# Patient Record
Sex: Female | Born: 2000 | Race: White | Hispanic: No | Marital: Single | Smoking: Never smoker
Health system: Southern US, Community
[De-identification: ages and names within clinical notes are randomized; demographics above are authoritative.]

---

## 2021-01-09 ENCOUNTER — Other Ambulatory Visit: Payer: Self-pay

## 2021-01-09 ENCOUNTER — Emergency Department (HOSPITAL_COMMUNITY)
Admission: EM | Admit: 2021-01-09 | Discharge: 2021-01-09 | Disposition: A | Discharge status: Home / Self Care | Payer: No Typology Code available for payment source | Attending: Emergency Medicine | Admitting: Emergency Medicine

## 2021-01-09 DIAGNOSIS — Z91010 Allergy to peanuts: Secondary | ICD-10-CM | POA: Diagnosis not present

## 2021-01-09 DIAGNOSIS — T782XXA Anaphylactic shock, unspecified, initial encounter: Secondary | ICD-10-CM | POA: Diagnosis present

## 2021-01-09 MED ORDER — FAMOTIDINE 40 MG PO TABS: 40.0000 mg | ORAL_TABLET | Freq: Every day | ORAL | 0 refills | Status: AC

## 2021-01-09 MED ORDER — FAMOTIDINE IN NACL 20-0.9 MG/50ML-% IV SOLN
20.0000 mg | Freq: Once | INTRAVENOUS | Status: AC
  Administered 2021-01-09: 20 mg via INTRAVENOUS
  Filled 2021-01-09: qty 50

## 2021-01-09 MED ORDER — METHYLPREDNISOLONE SODIUM SUCC 125 MG IJ SOLR
125.0000 mg | Freq: Once | INTRAMUSCULAR | Status: AC
  Administered 2021-01-09: 125 mg via INTRAVENOUS
  Filled 2021-01-09: qty 2

## 2021-01-09 MED ORDER — PREDNISONE 20 MG PO TABS: 40.0000 mg | ORAL_TABLET | Freq: Every day | ORAL | 0 refills | Status: DC

## 2021-01-09 MED ORDER — EPINEPHRINE 0.3 MG/0.3ML IJ SOAJ: 0.3000 mg | INTRAMUSCULAR | 1 refills | Status: AC | PRN

## 2021-01-09 NOTE — Discharge Instructions (Signed)
You were seen today for anaphylaxis.  Continue prednisone for the next 5 days.  You may take Pepcid and use Benadryl as needed.  If you have any recurrence of symptoms, you should be reevaluated immediately.  Use your EpiPen for any signs or symptoms of anaphylaxis.

## 2021-01-09 NOTE — ED Provider Notes (Signed)
[facility] DEPT Provider Note   CSN: [id number] Arrival date & time: 01/09/21  1949     History No chief complaint on file.   Amy Mccormick is a 20 y.o. female.  HPI     This is a 20 year old female with a history of anaphylaxis to peanuts who presents with an allergic reaction.  Patient reports that she unknowingly ate a granola bar with peanuts in it.  She began to have tingling of the lips and throat swelling.  She used her EpiPen.  She states she has a history of going into anaphylaxis so she actually used 2 EpiPen's around 7:20 PM.  She subsequently was given 50 mg of Benadryl by EMS.  On my evaluation, she is awake, alert.  She denies any ongoing symptoms.  She does states she feels a little jittery.  No chest pain or shortness of breath.  No ongoing throat swelling.  No rash.  No past medical history on file.  There are no problems to display for this patient.  No reported PMH  OB History   No obstetric history on file.     No family history on file.     Home Medications Prior to Admission medications   Medication Sig Start Date End Date Taking? Authorizing Provider  albuterol (VENTOLIN HFA) 108 (90 Base) MCG/ACT inhaler Inhale 1-2 puffs into the lungs as needed for wheezing, shortness of breath or wheezing. 10/27/17  Yes [provider]  EPINEPHrine 0.3 mg/0.3 mL IJ SOAJ injection Inject 0.3 mLs into the muscle once as needed for anaphylaxis. 10/22/10  Yes [provider]  EPINEPHrine 0.3 mg/0.3 mL IJ SOAJ injection Inject 0.3 mg into the muscle as needed for anaphylaxis. 01/09/21  Yes Kassandra Meriweather, Barbette Hair, MD  famotidine (PEPCID) 40 MG tablet Take 1 tablet (40 mg total) by mouth daily. 01/09/21  Yes Christiaan Strebeck, Barbette Hair, MD  levonorgestrel (MIRENA) 20 MCG/24HR IUD 1 each by Intrauterine route once. 2021 inserted   Yes [provider]  predniSONE (DELTASONE) 20 MG tablet Take 2 tablets (40 mg total) by mouth  daily. 01/09/21  Yes Nicolo Tomko, Barbette Hair, MD    Allergies    Peanut-containing drug products  Review of Systems   Review of Systems  Constitutional: Negative for fever.  HENT: Positive for trouble swallowing.   Respiratory: Negative for shortness of breath.   Gastrointestinal: Positive for nausea. Negative for abdominal pain and vomiting.  Skin: Negative for rash.  All other systems reviewed and are negative.   Physical Exam Updated Vital Signs BP (!) 115/59   Pulse (!) 57   Temp 98.5 F (36.9 C) (Oral)   Resp 20   Ht 1.651 m (5\' 5" )   Wt 59.9 kg   SpO2 99%   BMI 21.97 kg/m   Physical Exam Vitals and nursing note reviewed.  Constitutional:      Appearance: She is well-developed. She is not ill-appearing.     Comments: ABCs intact  HENT:     Head: Normocephalic and atraumatic.     Nose: Nose normal.     Mouth/Throat:     Mouth: Mucous membranes are moist.     Comments: No posterior oropharyngeal erythema Eyes:     Pupils: Pupils are equal, round, and reactive to light.  Cardiovascular:     Rate and Rhythm: Normal rate and regular rhythm.     Heart sounds: Normal heart sounds.  Pulmonary:     Effort: Pulmonary effort is normal. No respiratory  distress.     Breath sounds: No wheezing.  Abdominal:     General: Bowel sounds are normal.     Palpations: Abdomen is soft.     Tenderness: There is no abdominal tenderness.     Hernia: No hernia is present.  Musculoskeletal:     Cervical back: Neck supple.     Right lower leg: No edema.     Left lower leg: No edema.  Skin:    General: Skin is warm and dry.  Neurological:     Mental Status: She is alert and oriented to person, place, and time.  Psychiatric:        Mood and Affect: Mood normal.     ED Results / Procedures / Treatments   Labs (all labs ordered are listed, but only abnormal results are displayed) Labs Reviewed - No data to display  EKG None  Radiology No results  found.  Procedures Procedures   Medications Ordered in ED Medications  famotidine (PEPCID) IVPB 20 mg premix (0 mg Intravenous Stopped 01/09/21 2156)  methylPREDNISolone sodium succinate (SOLU-MEDROL) 125 mg/2 mL injection 125 mg (125 mg Intravenous Given 01/09/21 2027)    ED Course  I have reviewed the triage vital signs and the nursing notes.  Pertinent labs & imaging results that were available during my care of the patient were reviewed by me and considered in my medical decision making (see chart for details).    MDM Rules/Calculators/A&P                          Patient presents after an episode of anaphylaxis.  She administered 2 epinephrine pens within 5 minutes of each other.  Not clear that she was worsening but states she was anxious because she has had anaphylaxis in the past.  Currently her symptoms are much improved.  No signs or symptoms on exam.  Her O2 sats are 100% and her vital signs are reassuring.  She was given Benadryl in route.  She was subsequently given Pepcid and Solu-Medrol.  She was observed in the emergency department for approximately 3-1/2 hours.  She was observed for 4 hours post EpiPen administration without any recurrence of symptoms.  Will discharge with prednisone, Pepcid, Benadryl as needed.  EpiPen was refilled.  After history, exam, and medical workup I feel the patient has been appropriately medically screened and is safe for discharge home. Pertinent diagnoses were discussed with the patient. Patient was given return precautions.  Final Clinical Impression(s) / ED Diagnoses Final diagnoses:  Anaphylaxis, initial encounter    Rx / DC Orders ED Discharge Orders         Ordered    predniSONE (DELTASONE) 20 MG tablet  Daily        01/09/21 2303    EPINEPHrine 0.3 mg/0.3 mL IJ SOAJ injection  As needed        01/09/21 2303    famotidine (PEPCID) 40 MG tablet  Daily        01/09/21 2303           Merryl Hacker, MD 01/09/21 [phone]

## 2021-01-09 NOTE — ED Triage Notes (Signed)
Patient brought in by [facility] Patient allergic reaction to peanuts in a granola bar. Patient gave herself two epi pens. Patient had 50 mg of benadryl IV. Patient stills feels her throat is tight. Patient has a hx of peanut allergy.

## 2021-02-02 ENCOUNTER — Other Ambulatory Visit: Payer: Self-pay | Admitting: Adult Health

## 2021-02-02 ENCOUNTER — Other Ambulatory Visit (HOSPITAL_COMMUNITY): Payer: Self-pay | Admitting: Adult Health

## 2021-02-02 DIAGNOSIS — N110 Nonobstructive reflux-associated chronic pyelonephritis: Secondary | ICD-10-CM

## 2021-02-02 DIAGNOSIS — N3001 Acute cystitis with hematuria: Secondary | ICD-10-CM

## 2021-02-09 ENCOUNTER — Ambulatory Visit (HOSPITAL_COMMUNITY): Payer: No Typology Code available for payment source

## 2021-02-13 ENCOUNTER — Ambulatory Visit (HOSPITAL_COMMUNITY): Admission: RE | Admit: 2021-02-13 | Payer: No Typology Code available for payment source | Source: Ambulatory Visit

## 2021-02-13 ENCOUNTER — Encounter (HOSPITAL_COMMUNITY): Payer: Self-pay

## 2021-03-06 ENCOUNTER — Ambulatory Visit (HOSPITAL_COMMUNITY)
Admission: RE | Admit: 2021-03-06 | Discharge: 2021-03-06 | Disposition: A | Discharge status: Home / Self Care | Payer: No Typology Code available for payment source | Source: Ambulatory Visit | Attending: Adult Health | Admitting: Adult Health

## 2021-03-06 ENCOUNTER — Other Ambulatory Visit: Payer: Self-pay

## 2021-03-06 DIAGNOSIS — N3000 Acute cystitis without hematuria: Secondary | ICD-10-CM | POA: Insufficient documentation

## 2021-03-06 DIAGNOSIS — N3001 Acute cystitis with hematuria: Secondary | ICD-10-CM

## 2021-03-06 DIAGNOSIS — C641 Malignant neoplasm of right kidney, except renal pelvis: Secondary | ICD-10-CM | POA: Diagnosis not present

## 2021-03-06 DIAGNOSIS — N139 Obstructive and reflux uropathy, unspecified: Secondary | ICD-10-CM | POA: Insufficient documentation

## 2021-03-06 DIAGNOSIS — N110 Nonobstructive reflux-associated chronic pyelonephritis: Secondary | ICD-10-CM

## 2021-03-06 MED ORDER — IOTHALAMATE MEGLUMINE 17.2 % UR SOLN
250.0000 mL | Freq: Once | URETHRAL | Status: AC | PRN
  Administered 2021-03-06: 125 mL via INTRAVESICAL

## 2021-03-06 MED ORDER — DIATRIZOATE MEGLUMINE 30 % UR SOLN
URETHRAL | Status: DC | PRN
  Administered 2021-03-06: 125 mL

## 2021-06-24 ENCOUNTER — Ambulatory Visit (INDEPENDENT_AMBULATORY_CARE_PROVIDER_SITE_OTHER): Payer: No Typology Code available for payment source | Admitting: Orthopaedic Surgery

## 2021-06-24 ENCOUNTER — Encounter: Payer: Self-pay | Admitting: Orthopaedic Surgery

## 2021-06-24 DIAGNOSIS — S92352A Displaced fracture of fifth metatarsal bone, left foot, initial encounter for closed fracture: Secondary | ICD-10-CM

## 2021-06-24 NOTE — Progress Notes (Signed)
   Office Visit Note   Patient: Amy Mccormick           Date of Birth: 2001-09-05           MRN: [id number] Visit Date: 06/24/2021              Requested by: [place] [id number] [place],  [place] [address] PCP: Placido Sou, MD   Assessment & Plan: Visit Diagnoses:  1. Closed displaced fracture of fifth metatarsal bone of left foot, initial encounter     Plan: I spoke with her in length in detail about this fracture.  This is definitely nonoperative.  She can try to put some weight on her foot in a postop shoe but let pain be her guide.  I will give her a note keeping her out of dance and out of work until further notice.  All questions and concerns were answered and addressed.  She can use an Ace wrap for support and comfort as well.  I showed them how to wrap her foot.  I would like to see her back in 2 weeks with a repeat 3 views the left foot.  Follow-Up Instructions: Return in about 2 weeks (around 07/08/2021).   Orders:  No orders of the defined types were placed in this encounter.  No orders of the defined types were placed in this encounter.     Procedures: No procedures performed   Clinical Data: No additional findings.   Subjective: No chief complaint on file. Amy Mccormick comes in today for evaluation treatment of an acute left foot fracture.  She was seen at an outlying clinic and x-rays showed an oblique fracture that was minimally displaced of the left foot fifth metatarsal shaft.  Her mother actually has the x-rays on her phone and I was able to review these.  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  She is in a postop shoe with crutches as well.  She has been nonweightbearing.  She had an accident falling down some stairs on Monday injuring this foot.  She is not a smoker not a diabetic.  She has no significant active medical issues other than asthma.  HPI  Review of Systems There is no listed acute  medical problems such as chest pain, headache, fever, chills, nausea, vomiting  Objective: Vital Signs: There were no vitals taken for this visit.  Physical Exam She is alert and orient x3 and in no acute distress Ortho Exam Her left foot does have appropriate swelling and bruising around the midfoot and dorsally and lateral.  Her foot is well-perfused and her ankle is well located and her foot is aligned appropriately. Specialty Comments:  No specialty comments available.  Imaging: No results found. X-rays on her mother's phone are reviewed and it does show metatarsal shaft fracture of the fifth metatarsal with minimal displacement.  PMFS History: There are no problems to display for this patient.  History reviewed. No pertinent past medical history.  History reviewed. No pertinent family history.  History reviewed. No pertinent surgical history. Social History   Occupational History   Not on file  Tobacco Use   Smoking status: Not on file   Smokeless tobacco: Not on file  Substance and Sexual Activity   Alcohol use: Not on file   Drug use: Not on file   Sexual activity: Not on file

## 2021-07-02 ENCOUNTER — Telehealth: Payer: Self-pay | Admitting: Physician Assistant

## 2021-07-02 NOTE — Telephone Encounter (Signed)
Pt called for refill on hydrocodone.

## 2021-07-03 ENCOUNTER — Other Ambulatory Visit: Payer: Self-pay | Admitting: Orthopaedic Surgery

## 2021-07-03 MED ORDER — HYDROCODONE-ACETAMINOPHEN 5-325 MG PO TABS: 1.0000 | ORAL_TABLET | Freq: Four times a day (QID) | ORAL | 0 refills | Status: DC | PRN

## 2021-07-03 NOTE — Telephone Encounter (Signed)
Please advise 

## 2021-07-03 NOTE — Telephone Encounter (Signed)
Lvm informing pt.

## 2021-07-09 ENCOUNTER — Ambulatory Visit (INDEPENDENT_AMBULATORY_CARE_PROVIDER_SITE_OTHER): Payer: No Typology Code available for payment source | Admitting: Physician Assistant

## 2021-07-09 ENCOUNTER — Ambulatory Visit: Payer: Self-pay

## 2021-07-09 DIAGNOSIS — S92352D Displaced fracture of fifth metatarsal bone, left foot, subsequent encounter for fracture with routine healing: Secondary | ICD-10-CM

## 2021-07-09 DIAGNOSIS — S92352A Displaced fracture of fifth metatarsal bone, left foot, initial encounter for closed fracture: Secondary | ICD-10-CM

## 2021-07-09 NOTE — Progress Notes (Signed)
   Office Visit Note   Patient: Amy Mccormick           Date of Birth: [birth year]           MRN: [id number] Visit Date: 07/09/2021              Requested by: [place] [id number] [place],  [place] [address] PCP: Placido Sou, MD   Assessment & Plan: Visit Diagnoses:  1. Closed displaced fracture of fifth metatarsal bone of left foot, initial encounter   2. Closed displaced fracture of fifth metatarsal bone of left foot with routine healing, subsequent encounter     Plan: Place her in a cam walker boot she can begin 50% weightbearing.  Then at approximately 6 weeks post injury have her go to full weightbearing in the cam walker boot.  Elevation wiggling toes encouraged.  Questions encouraged and answered at length  Follow-Up Instructions: Return in about 4 weeks (around 08/06/2021).   Orders:  Orders Placed This Encounter  Procedures   XR Foot Complete Left   No orders of the defined types were placed in this encounter.     Procedures: No procedures performed   Clinical Data: No additional findings.   Subjective: Chief Complaint  Patient presents with   Left Foot - Follow-up    HPI Gizell returns today approximately 2 and half weeks status post left foot fifth metatarsal shaft fracture.  She ambulates in the postop shoe and crutches.  Notes that the swelling and bruising have been diminished.  She is basically been nonweightbearing she does not feel that she gives her much support.  She has had no shortness of breath no calf pain. Review of Systems See HPI.  Objective: Vital Signs: There were no vitals taken for this visit.  Physical Exam General well-developed well-nourished female no acute distress mood affect appropriate Ortho Exam Left foot tenderness over the fifth metatarsal shaft.  No significant ecchymosis bruising.  There is no rashes skin lesions ulcerations or impending ulcers involving the left foot.  Dorsal pedal pulses  2+.  Left calf supple nontender.  Dorsiflexion plantarflexion left ankle intact.  EHL/ FHL intact left foot. Specialty Comments:  No specialty comments available.  Imaging: Radiographs 3 views left foot: Oblique fracture of the fifth metatarsal shaft remains unchanged in overall position alignment.  There is no appreciable callus formation.  No other fractures identified.   PMFS History: There are no problems to display for this patient.  No past medical history on file.  No family history on file.  No past surgical history on file. Social History   Occupational History   Not on file  Tobacco Use   Smoking status: Not on file   Smokeless tobacco: Not on file  Substance and Sexual Activity   Alcohol use: Not on file   Drug use: Not on file   Sexual activity: Not on file

## 2021-08-06 ENCOUNTER — Ambulatory Visit: Payer: Self-pay

## 2021-08-06 ENCOUNTER — Ambulatory Visit: Payer: No Typology Code available for payment source | Admitting: Physician Assistant

## 2021-08-06 DIAGNOSIS — S92352A Displaced fracture of fifth metatarsal bone, left foot, initial encounter for closed fracture: Secondary | ICD-10-CM | POA: Diagnosis not present

## 2021-08-06 NOTE — Progress Notes (Signed)
HPI: Amy Mccormick returns today for follow-up of her left foot fifth metatarsal shaft fracture.  Again she injured the foot on 06/22/2021.  She comes in today worried.  Regular shoes.  She has been wearing these for the last 2 weeks.  She was to remain in a cam walker boot.  States that it made her legs uneven.  She does note that the foot is overall less tender.  She states she can do stairs now.  She has not tried dancing again as of yet.  She does have a dance class in college.  Stating nothing for pain at this point.  She is trying ice heat and rest to control the discomfort/pain in the foot.  Physical exam: General well-developed well-nourished female no acute distress.  Ambulates without any assistive device.  Left foot she has some mild tenderness with manipulation of the fifth metatarsal shaft region.  There is some palpable callus.  No skin breakdown.  Foot is neurovascular intact.  Radiographs: Left foot 3 views shows the fifth metatarsal shaft fracture to be unchanged in overall position alignment.  There is some early callus formation.  Fracture site still evident both.  Impression: Left foot fifth metatarsal shaft fracture  Plan: At this point time I do like for her to go back in the cam walker boot weightbearing as tolerated.  She needs to be in the boot whenever she is up ambulating.  She is to refrain from any dance or sports activities.  See her back in 1 month obtain 3 views of the left foot at that time.  Questions encouraged and answered at length

## 2021-09-03 ENCOUNTER — Other Ambulatory Visit: Payer: Self-pay

## 2021-09-03 ENCOUNTER — Ambulatory Visit (INDEPENDENT_AMBULATORY_CARE_PROVIDER_SITE_OTHER): Payer: No Typology Code available for payment source

## 2021-09-03 ENCOUNTER — Ambulatory Visit (INDEPENDENT_AMBULATORY_CARE_PROVIDER_SITE_OTHER): Payer: No Typology Code available for payment source | Admitting: Physician Assistant

## 2021-09-03 ENCOUNTER — Encounter: Payer: Self-pay | Admitting: Physician Assistant

## 2021-09-03 DIAGNOSIS — S92352D Displaced fracture of fifth metatarsal bone, left foot, subsequent encounter for fracture with routine healing: Secondary | ICD-10-CM | POA: Diagnosis not present

## 2021-09-03 NOTE — Progress Notes (Signed)
HPI: Amy Mccormick returns today now 10 weeks status post left foot fifth metatarsal shaft fracture.  States she is having no real pain in the foot at this point in time.  She just has an achiness whenever her foot gets cold.  She is in the cam walker boot.  Physical exam: General well-developed well-nourished female who walks without any assistive device.  Left foot: She has no tenderness with palpation over the fifth metatarsal shaft region.  There is palpable callus.  No skin breakdown.  Radiographs: 3 views left foot show the fracture site with some interval consolidation at the fracture site still quite evident.  No change in overall position alignment.  No other acute fractures identified.   Impression: Left foot fifth metatarsal fracture  Plan: At this point time she will remain in a cam walker boot.  We will see her back around November 30 to repeat radiographs of the foot if the fracture is still evident recommend bone stimulator.  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  She would like to avoid surgery if possible.  Questions were encouraged and answered.

## 2021-09-16 ENCOUNTER — Encounter: Payer: Self-pay | Admitting: Physician Assistant

## 2021-09-16 ENCOUNTER — Ambulatory Visit (INDEPENDENT_AMBULATORY_CARE_PROVIDER_SITE_OTHER): Payer: No Typology Code available for payment source | Admitting: Physician Assistant

## 2021-09-16 ENCOUNTER — Ambulatory Visit (INDEPENDENT_AMBULATORY_CARE_PROVIDER_SITE_OTHER): Payer: No Typology Code available for payment source

## 2021-09-16 ENCOUNTER — Other Ambulatory Visit: Payer: Self-pay

## 2021-09-16 DIAGNOSIS — S92352D Displaced fracture of fifth metatarsal bone, left foot, subsequent encounter for fracture with routine healing: Secondary | ICD-10-CM

## 2021-09-16 DIAGNOSIS — S92352K Displaced fracture of fifth metatarsal bone, left foot, subsequent encounter for fracture with nonunion: Secondary | ICD-10-CM

## 2021-09-16 NOTE — Progress Notes (Signed)
HPI: Amy Mccormick returns today follow-up of her left fifth metatarsal shaft fracture.  Again she injured her foot on 06/22/2021.  She states she is having no significant pain in the left foot.  However she is trying to get back to dance at a high level of activity.  She continues to wear the cam walker boot.  Physical exam: General well-developed well-nourished female no acute distress. Left foot no tenderness with manipulation palpation of the fifth metatarsal shaft region.  There is palpable callus pain.  No rashes skin lesions ulcerations or impending ulcers.  Left foot is neurovascular intact.   Radiographs: 3 views left foot shows the fifth metatarsal shaft fracture and change in overall position alignment.  Minimal callus formation seen at the fracture site consistent with a nonunion.  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  She will continue cam walker boot.  We will see her back in 1 month obtain 3 views of her left foot at that time.  Questions encouraged and answered at length.

## 2021-09-17 ENCOUNTER — Other Ambulatory Visit: Payer: Self-pay

## 2021-09-25 ENCOUNTER — Telehealth: Payer: Self-pay | Admitting: Orthopaedic Surgery

## 2021-09-25 NOTE — Telephone Encounter (Signed)
Pt states she need a call back. She need a much sooner appt. Explained to pt one opening until 1/4 past her 12/28 appt. She states she need xrays and and  Dr. Ninfa [place] told her she could have sooner appt. Please call pt at [phone]

## 2021-09-25 NOTE — Telephone Encounter (Signed)
Called and scheduled

## 2021-09-28 ENCOUNTER — Encounter: Payer: Self-pay | Admitting: Physician Assistant

## 2021-09-28 ENCOUNTER — Ambulatory Visit: Payer: Self-pay

## 2021-09-28 ENCOUNTER — Ambulatory Visit (INDEPENDENT_AMBULATORY_CARE_PROVIDER_SITE_OTHER): Payer: No Typology Code available for payment source | Admitting: Physician Assistant

## 2021-09-28 DIAGNOSIS — S92352D Displaced fracture of fifth metatarsal bone, left foot, subsequent encounter for fracture with routine healing: Secondary | ICD-10-CM

## 2021-09-28 DIAGNOSIS — S92302K Fracture of unspecified metatarsal bone(s), left foot, subsequent encounter for fracture with nonunion: Secondary | ICD-10-CM

## 2021-09-28 NOTE — Progress Notes (Signed)
HPI: Amy Mccormick returns today for follow-up of her left fifth metatarsal shaft fracture.  Again she has developed a nonunion.  Fracture injury occurred on 06/22/2021.  She is here for radiographs of the left foot.  Radiographs: 3 views left foot show the fifth metatarsal shaft fracture to still be evident.  This is consistent with nonunion.  Again there is minimal callus that remains unchanged from radiographs on 09/16/2021.  Impression: Left foot fifth metatarsal fracture nonunion  Plan: Recommend bone stimulator.  Would like to see her back in 1 month after starting bone stimulator to the fifth metatarsal fracture.  Questions were encouraged and answered.

## 2021-10-14 ENCOUNTER — Encounter: Payer: Self-pay | Admitting: Orthopaedic Surgery

## 2021-10-14 ENCOUNTER — Other Ambulatory Visit: Payer: Self-pay

## 2021-10-14 ENCOUNTER — Ambulatory Visit (INDEPENDENT_AMBULATORY_CARE_PROVIDER_SITE_OTHER): Payer: No Typology Code available for payment source | Admitting: Orthopaedic Surgery

## 2021-10-14 DIAGNOSIS — S92352D Displaced fracture of fifth metatarsal bone, left foot, subsequent encounter for fracture with routine healing: Secondary | ICD-10-CM

## 2021-10-14 NOTE — Progress Notes (Signed)
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  There is been a delayed union of the fracture.  She finally got a bone stimulator about 2 weeks ago.  She is not a smoker and is very healthy and active.  She reports no pain at all now with her fifth metatarsal.  She has been compliant with wearing her cam walking boot.  On exam there is no motion between the distal and proximal metatarsal on the left foot in terms of the fifth metatarsal when I stress it.  There is no pain when I stressed that as well.  I did not x-ray today since she is only had a bone stimulator for 2 weeks.  The last x-rays showed fracture line still visible but abundant callus formation.  At this point I do feel that she is going to go on to heal this fracture.  We will see her in 2 weeks with a repeat 3 views of the left foot.  She can stop her cam walking boot.  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  Hopefully in 2 weeks we will let her know about getting back to dancing as well.  I am encouraged by the fact that she is asymptomatic.

## 2021-10-28 ENCOUNTER — Ambulatory Visit: Payer: No Typology Code available for payment source | Admitting: Orthopaedic Surgery

## 2021-10-28 ENCOUNTER — Encounter: Payer: Self-pay | Admitting: Orthopaedic Surgery

## 2021-10-28 ENCOUNTER — Ambulatory Visit (INDEPENDENT_AMBULATORY_CARE_PROVIDER_SITE_OTHER): Payer: No Typology Code available for payment source

## 2021-10-28 DIAGNOSIS — S92352D Displaced fracture of fifth metatarsal bone, left foot, subsequent encounter for fracture with routine healing: Secondary | ICD-10-CM

## 2021-10-28 NOTE — Progress Notes (Signed)
The patient is now just over 4 months into a left foot fifth metatarsal shaft fracture.  She is only 21 years old.  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  She denies any pain now over her fifth metatarsal.  We have been having her use a bone stimulator as well due to a nonunion/delayed union.  She says she really feels normal at this standpoint.  I did process the fracture site of her left foot and she has no pain at all.  There is no mobility between the fracture fragments at all over her left foot fifth metatarsal.  3 views of the foot are reviewed today x-ray was and compared to previous x-rays.  There is abundant healing and I believe the fracture is healed based on clinical exam and x-ray findings.  This point she will return to dancing as comfort allows.  There is no restrictions.  If she starts developing foot pain she will let us know.  I did have her continue the bone stimulator for another 4 weeks.  Follow-up is as needed otherwise.

## 2021-12-26 IMAGING — RF DG VCUG
9 of 13 series · 13 of 19 positions shown · non-contrast
Comparison: Renal ultrasound of 01/16/2021, without report.

CLINICAL DATA: Recurrent urinary tract infections.

EXAM:
VOIDING CYSTOURETHROGRAM
TECHNIQUE: After catheterization of the urinary bladder following sterile
technique by nursing personnel, the bladder was filled with 125 cc
ml Cysto-hypaque 30% by drip infusion. Serial spot images were
obtained during bladder filling and voiding.
FLUOROSCOPY TIME:  Fluoroscopy Time:  2 minutes and 36 seconds
Radiation Exposure Index (if provided by the fluoroscopic device):
22.7 mGy
Number of Acquired Spot Images: 0

[Series 1: cp_standard · 0.30mm/px · 1 of 1 slices shown (1 of 9)]
[im 1/1]
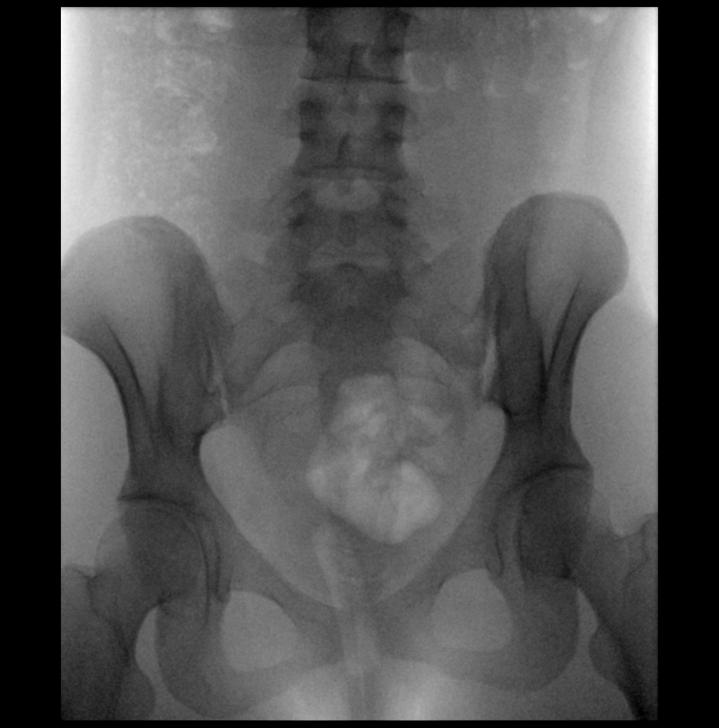

[Series 3: cp_standard · 0.20mm/px · 1 of 1 slices shown (2 of 9)]
[im 1/1]
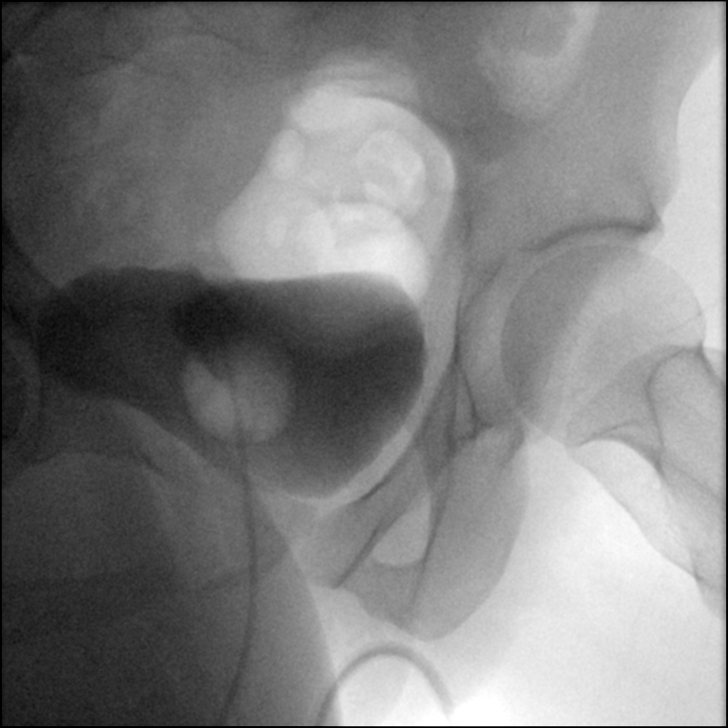

[Series 4: cp_standard · 0.20mm/px · 1 of 1 slices shown (3 of 9)]
[im 1/1]
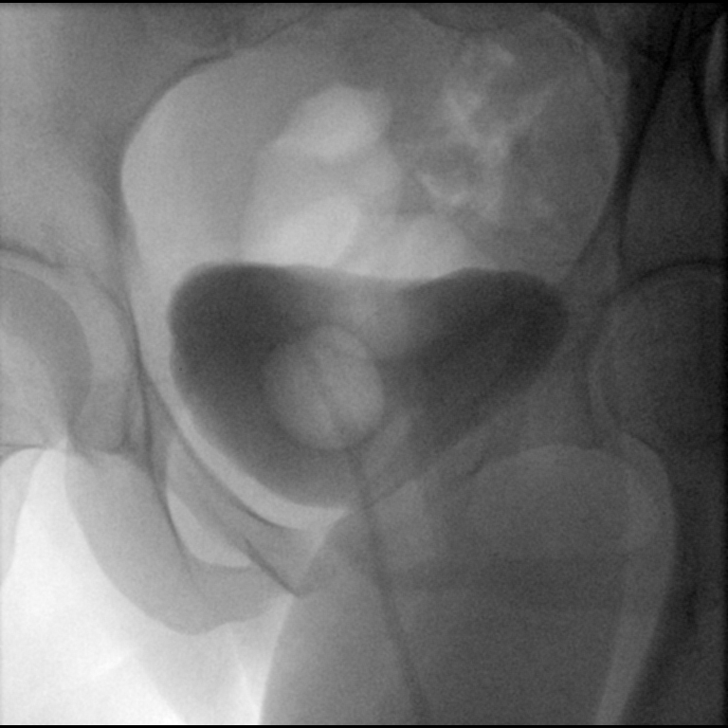

[Series 6: cp_standard · 0.20mm/px · 1 of 1 slices shown (4 of 9)]
[im 1/1]
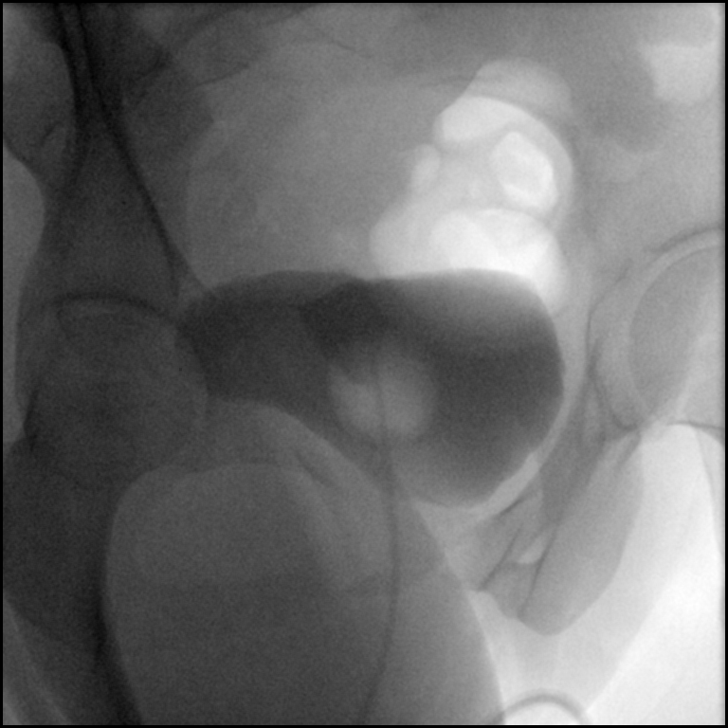

[Series 7: cp_standard · 0.20mm/px · 1 of 1 slices shown (5 of 9)]
[im 1/1]
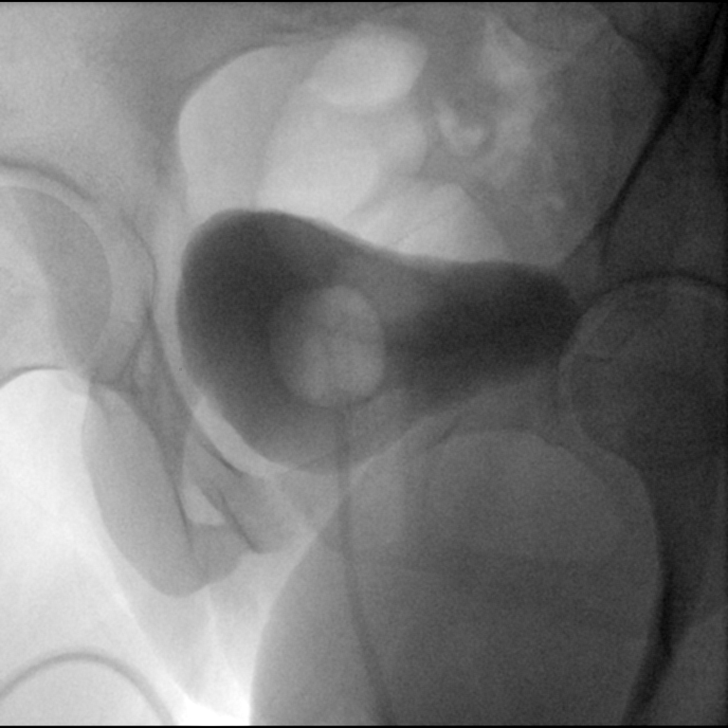

[Series 9: cp_standard · 0.40mm/px · 3 of 368 frames shown (6 of 9)]
[frame 56/368]
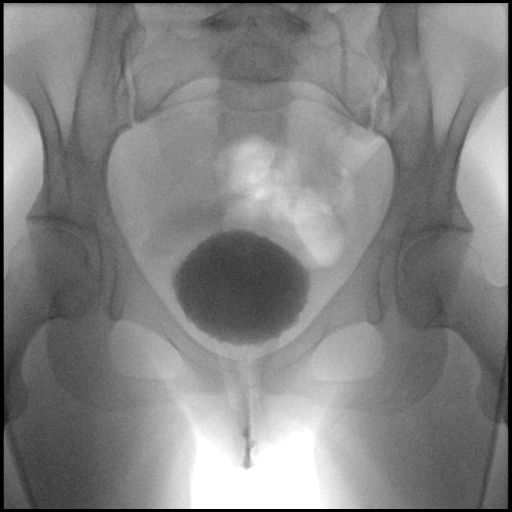
[frame 185/368]
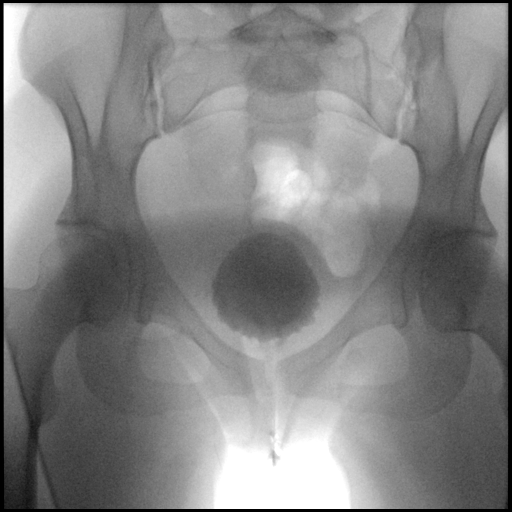
[frame 313/368]
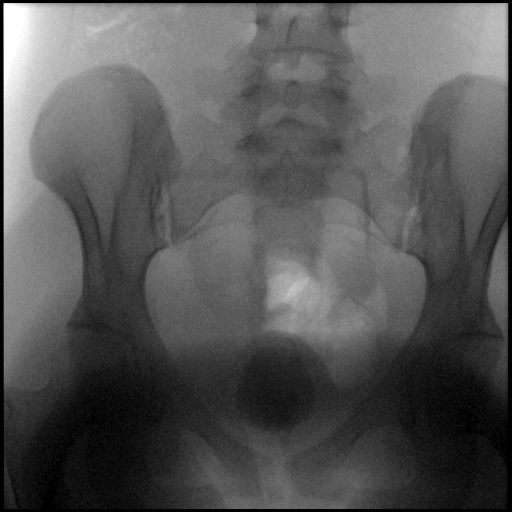

[Series 10: cp_standard · 0.40mm/px · 3 of 107 frames shown (7 of 9)]
[frame 17/107]
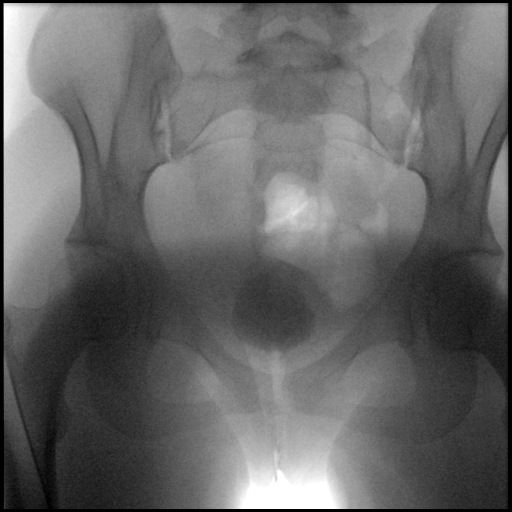
[frame 54/107]
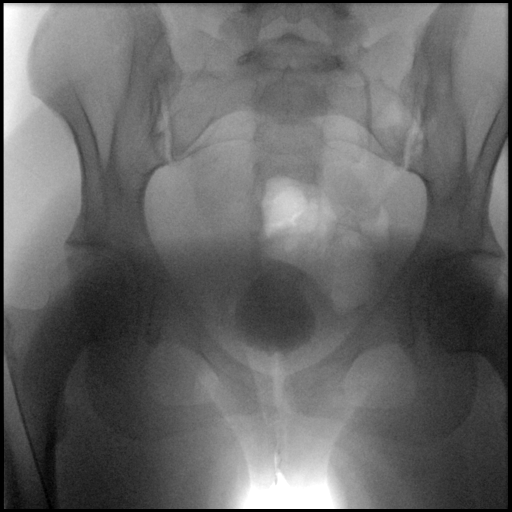
[frame 91/107]
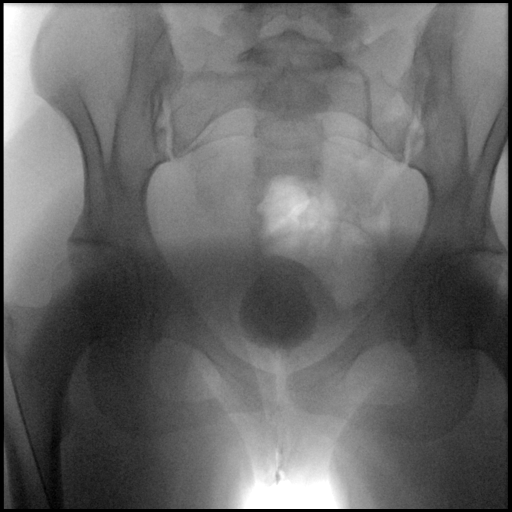

[Series 11: cp_standard · 0.20mm/px · 1 of 1 slices shown (8 of 9)]
[im 1/1]
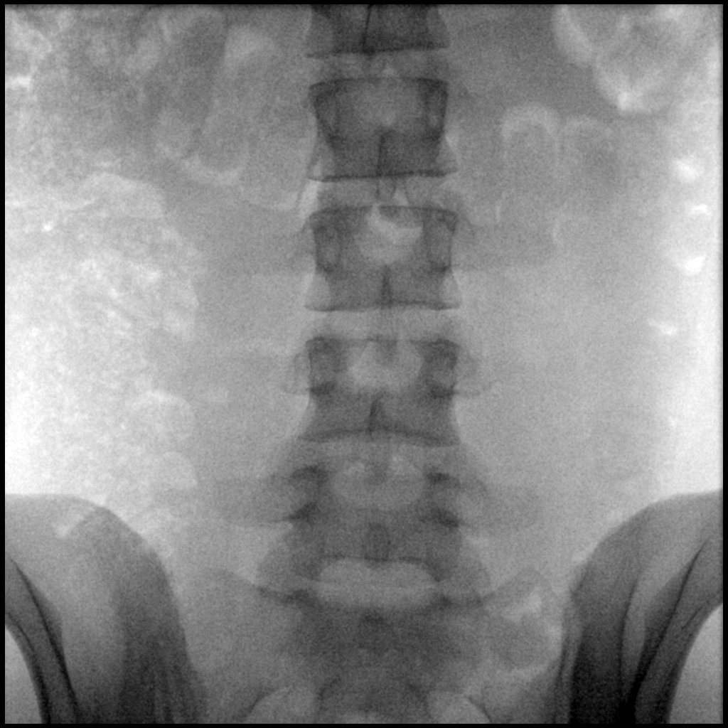

[Series 13: cp_standard · 0.30mm/px · 1 of 1 slices shown (9 of 9)]
[im 1/1]
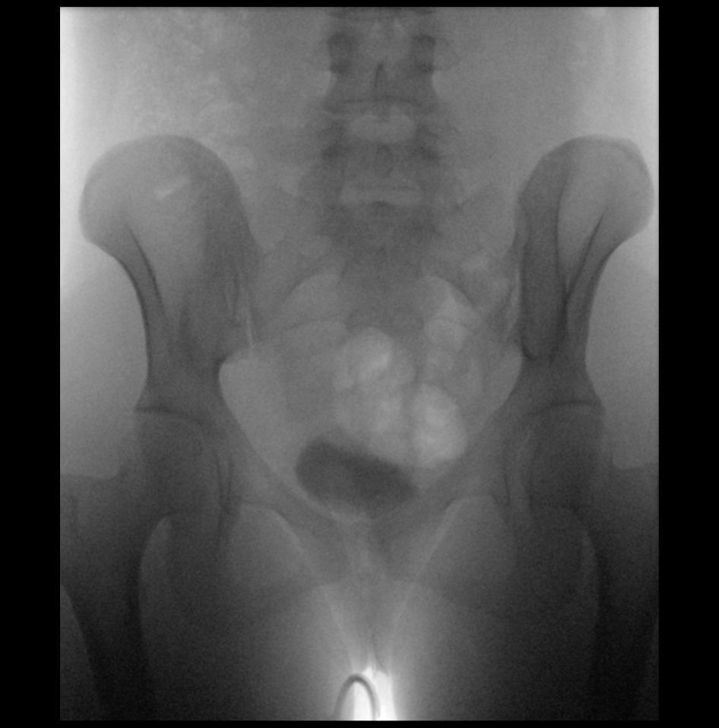

[13 of 19 positions shown; findings below may reference images not displayed]

FINDINGS: Preprocedure scout film demonstrates nonobstructive bowel gas
pattern. A tampon is in place.

Partial and full images of the bladder demonstrate normal bladder
caliber. No vesicoureteral reflux.

With voiding, the urethra is grossly normal. Contrast fills the tub
surrounding the patient. No evidence of vesicoureteral reflux.
Minimal postvoid residual.
IMPRESSION: Normal voiding cystourethrogram.

## 2023-02-01 ENCOUNTER — Emergency Department (HOSPITAL_COMMUNITY): Payer: No Typology Code available for payment source

## 2023-02-01 ENCOUNTER — Encounter (HOSPITAL_COMMUNITY): Payer: Self-pay

## 2023-02-01 ENCOUNTER — Emergency Department (HOSPITAL_COMMUNITY)
Admission: EM | Admit: 2023-02-01 | Discharge: 2023-02-02 | Disposition: A | Discharge status: Home / Self Care | Payer: No Typology Code available for payment source | Attending: Emergency Medicine | Admitting: Emergency Medicine

## 2023-02-01 ENCOUNTER — Other Ambulatory Visit: Payer: Self-pay

## 2023-02-01 DIAGNOSIS — W1789XA Other fall from one level to another, initial encounter: Secondary | ICD-10-CM | POA: Diagnosis not present

## 2023-02-01 DIAGNOSIS — S99922A Unspecified injury of left foot, initial encounter: Secondary | ICD-10-CM | POA: Diagnosis present

## 2023-02-01 DIAGNOSIS — Y9289 Other specified places as the place of occurrence of the external cause: Secondary | ICD-10-CM | POA: Diagnosis not present

## 2023-02-01 DIAGNOSIS — Z91010 Allergy to peanuts: Secondary | ICD-10-CM | POA: Insufficient documentation

## 2023-02-01 NOTE — ED Triage Notes (Signed)
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 Rolled left ankle. Was able to finish performance but pain intensified and began swelling.   Neuro intact.

## 2023-02-02 ENCOUNTER — Telehealth: Payer: Self-pay | Admitting: Orthopaedic Surgery

## 2023-02-02 NOTE — ED Provider Notes (Incomplete)
[place] [facility] AT [facility] Provider Note   CSN: [id number] Arrival date & time: 02/01/23  2226     History  Chief Complaint  Patient presents with  . Foot Injury    Amy Mccormick is a 22 y.o. female with no documented medical history.  Patient presents to ED for evaluation of left foot injury.  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  Patient reports that she landed on the lateral portion of her left foot.  Patient reports history of metatarsal fracture of the left foot in 2022.  Patient states that when she landed on her lateral left foot she had immediate pain.  Patient denies pain in her ankle, falling or hitting her head.  Patient denies medications prior to arrival.   Foot Injury      Home Medications Prior to Admission medications   Medication Sig Start Date End Date Taking? Authorizing Provider  albuterol (VENTOLIN HFA) 108 (90 Base) MCG/ACT inhaler Inhale 1-2 puffs into the lungs as needed for wheezing, shortness of breath or wheezing. 10/27/17   [provider]  EPINEPHrine 0.3 mg/0.3 mL IJ SOAJ injection Inject 0.3 mLs into the muscle once as needed for anaphylaxis. 10/22/10   [provider]  EPINEPHrine 0.3 mg/0.3 mL IJ SOAJ injection Inject 0.3 mg into the muscle as needed for anaphylaxis. 01/09/21   Horton, Mayer Masker, MD  famotidine (PEPCID) 40 MG tablet Take 1 tablet (40 mg total) by mouth daily. 01/09/21   Horton, Mayer Masker, MD  LO LOESTRIN FE 1 MG-10 MCG / 10 MCG tablet Take 1 tablet by mouth daily. 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 EXAM: LEFT FOOT - COMPLETE 3+ VIEW COMPARISON:  Radiographs dated October 28, 2021 FINDINGS: There is no evidence of fracture or dislocation. Chronic healed fracture of the distal fifth metatarsal shaft. There is no evidence of arthropathy or other focal bone abnormality. Soft tissue swelling about the lateral aspect of the foot. IMPRESSION: 1. No acute fracture or dislocation. 2. Chronic healed fracture of the distal fifth metatarsal shaft. Electronically Signed   By: Larose Hires D.O.   On: 02/01/2023 23:11    Procedures Procedures  {Document cardiac monitor, telemetry assessment procedure when appropriate:1}  Medications Ordered in ED Medications - No data to display  ED Course/ Medical Decision Making/ A&P   {   Click here for ABCD2, HEART and other calculatorsREFRESH Note before signing :1}                          Medical Decision Making Amount and/or Complexity of Data Reviewed Radiology: ordered.   ***  {  Document critical care time when appropriate:1} {Document review of labs and clinical decision tools ie heart score, Chads2Vasc2 etc:1}  {Document your independent review of radiology images, and any outside records:1} {Document your discussion with family members, caretakers, and with consultants:1} {Document social determinants of health affecting pt's care:1} {Document your decision making why or why not admission, treatments were needed:1} Final Clinical Impression(s) / ED Diagnoses Final diagnoses:  None    Rx / DC Orders ED Discharge Orders     None

## 2023-02-02 NOTE — Telephone Encounter (Signed)
Patient need a refill on her bone stimulator. Please advise.

## 2023-02-02 NOTE — Discharge Instructions (Addendum)
Return to the ED with new or worsening signs or symptoms Please follow-[facility] with [facility] for further management Please keep your left foot Ace wrapped.  He may RICE your left foot.  This stands for rest, ice, compression and elevation. Please take a rest from dance for the next 5 days to allow your foot to heal

## 2023-02-02 NOTE — ED Provider Notes (Signed)
[place] [facility]MENT AT [facility] Provider Note   CSN: [id number] Arrival date & time: 02/01/23  2226     History  Chief Complaint  Patient presents with   Foot Injury    Amy Mccormick is a 22 y.o. female with no documented medical history.  Patient presents to ED for evaluation of left foot injury.  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  Patient reports that she landed on the lateral portion of her left foot.  Patient reports history of metatarsal fracture of the left foot in 2022.  Patient states that when she landed on her lateral left foot she had immediate pain.  Patient denies pain in her ankle, falling or hitting her head.  Patient denies medications prior to arrival.   Foot Injury      Home Medications Prior to Admission medications   Medication Sig Start Date End Date Taking? Authorizing Provider  albuterol (VENTOLIN HFA) 108 (90 Base) MCG/ACT inhaler Inhale 1-2 puffs into the lungs as needed for wheezing, shortness of breath or wheezing. 10/27/17   [provider]  EPINEPHrine 0.3 mg/0.3 mL IJ SOAJ injection Inject 0.3 mLs into the muscle once as needed for anaphylaxis. 10/22/10   [provider]  EPINEPHrine 0.3 mg/0.3 mL IJ SOAJ injection Inject 0.3 mg into the muscle as needed for anaphylaxis. 01/09/21   Horton, Mayer Masker, MD  famotidine (PEPCID) 40 MG tablet Take 1 tablet (40 mg total) by mouth daily. 01/09/21   Horton, Mayer Masker, MD  LO LOESTRIN FE 1 MG-10 MCG / 10 MCG tablet Take 1 tablet by mouth daily. 07/15/21   [provider]      Allergies    Peanut-containing drug products    Review of Systems   Review of Systems  Musculoskeletal:  Positive for myalgias.  All other systems reviewed and are negative.   Physical Exam Updated Vital Signs BP 111/69 (BP Location: Right Arm)   Pulse 73   Temp 98.4 F (36.9 C) (Oral)   Resp 16   Ht  (1.651 m)   Wt  59 kg   LMP 01/11/2023 (Approximate)   SpO2 99%   BMI 21.63 kg/m  Physical Exam Vitals and nursing note reviewed.  Constitutional:      General: She is not in acute distress.    Appearance: Normal appearance. She is not ill-appearing, toxic-appearing or diaphoretic.  HENT:     Head: Normocephalic and atraumatic.     Nose: Nose normal.     Mouth/Throat:     Mouth: Mucous membranes are moist.     Pharynx: Oropharynx is clear.  Eyes:     Extraocular Movements: Extraocular movements intact.     Conjunctiva/sclera: Conjunctivae normal.     Pupils: Pupils are equal, round, and reactive to light.  Cardiovascular:     Rate and Rhythm: Normal rate and regular rhythm.  Pulmonary:     Effort: Pulmonary effort is normal.     Breath sounds: Normal breath sounds. No wheezing.  Abdominal:     General: Abdomen is flat. Bowel sounds are normal.     Palpations: Abdomen is soft.     Tenderness: There is no abdominal tenderness.  Musculoskeletal:     Cervical back: Normal range of motion and neck supple. No tenderness.       Feet:  Feet:     Comments: No overlying skin change to the lateral left foot.  Neurovascularly intact.  2+ DP pulse.  Patient able to bear weight without issue. Skin:    General: Skin is warm and dry.     Capillary Refill: Capillary refill takes less than 2 seconds.  Neurological:     Mental Status: She is alert and oriented to person, place, and time.     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 EXAM: LEFT FOOT - COMPLETE 3+ VIEW COMPARISON:  Radiographs dated October 28, 2021 FINDINGS: There is no evidence of fracture or dislocation. Chronic healed fracture of the distal fifth metatarsal shaft. There is no  evidence of arthropathy or other focal bone abnormality. Soft tissue swelling about the lateral aspect of the foot. IMPRESSION: 1. No acute fracture or dislocation. 2. Chronic healed fracture of the distal fifth metatarsal shaft. Electronically Signed   By: Larose Hires D.O.   On: 02/01/2023 23:11    Procedures Procedures   Medications Ordered in ED Medications - No data to display  ED Course/ Medical Decision Making/ A&P  Medical Decision Making Amount and/or Complexity of Data Reviewed Radiology: ordered.   79 old female presents ED for evaluation.  On examination the patient left lateral foot has no overlying skin change.  He is neurovascularly intact.  The patient is a 2+ DP pulse in her left foot.  Brisk capillary refill.  Patient able to bear weight without issue and ambulate around hospital room.  Patient imaging shows no acute fracture or dislocation.  There is a chronic healed fracture of the distal fifth metatarsal shaft.  Patient provided ibuprofen.  At this time, patient will have her foot Ace wrapped and follow-up with student health.  Patient advised to return to the ED with any new or worsening signs or symptoms.  Patient voiced understanding.  Patient discharged in stable condition.   Final Clinical Impression(s) / ED Diagnoses Final diagnoses:  Injury of left foot, initial encounter    Rx / DC Orders ED Discharge Orders     None         Al Decant, PA-C 02/02/23 0008    Elayne Snare K, DO 02/02/23 1454

## 2023-02-17 ENCOUNTER — Ambulatory Visit: Payer: No Typology Code available for payment source | Admitting: Physician Assistant

## 2023-02-17 ENCOUNTER — Encounter: Payer: Self-pay | Admitting: Physician Assistant

## 2023-02-17 ENCOUNTER — Other Ambulatory Visit (INDEPENDENT_AMBULATORY_CARE_PROVIDER_SITE_OTHER): Payer: No Typology Code available for payment source

## 2023-02-17 DIAGNOSIS — M79672 Pain in left foot: Secondary | ICD-10-CM

## 2023-02-17 NOTE — Progress Notes (Signed)
   Office Visit Note   Patient: Amy Mccormick           Date of Birth: 11-20-2000           MRN: [id number] Visit Date: 02/17/2023              Requested by: [place] [id number] [place],  [place] [address] PCP: Ellin Saba, MD   Assessment & Plan: Visit Diagnoses:  1. Left foot pain     Plan:  She is activities as tolerated.  Questions were encouraged and answered at length.  Radiographs were reviewed with the patient.  Follow-Up Instructions: No follow-ups on file.   Orders:  Orders Placed This Encounter  Procedures   XR Foot Complete Left   No orders of the defined types were placed in this encounter.     Procedures: No procedures performed   Clinical Data: No additional findings.   Subjective: Chief Complaint  Patient presents with   Left Foot - Pain    HPI Amy Mccormick is a 22 year old female comes in today for follow-up of her left foot.  She fell when she jumped off stage on 02/01/2023.  She went to the ER where radiographs were obtained and showed no acute fracture.  She comes in today just because she does have a history of a left fifth mid metatarsal fracture last year.  Foot remains sore but is improving.  States that it is slightly achy. Review of Systems See HPI  Objective: Vital Signs: LMP 01/11/2023 (Approximate)   Physical Exam Constitutional:      Appearance: She is not ill-appearing or diaphoretic.  Pulmonary:     Effort: Pulmonary effort is normal.  Neurological:     Mental Status: She is alert.  Psychiatric:        Mood and Affect: Mood normal.     Ortho Exam Left foot full dorsiflexion plantarflexion.  No weakness with eversion against resistance.  Extreme inversion causes no pain.  She has slight tenderness over the midshaft of the fifth metatarsal.  There is no rashes skin lesions ulcerations or ecchymosis. Specialty Comments:  No specialty comments available.  Imaging: XR Foot Complete Left  Result  Date: 02/17/2023 Left foot 3 views: No acute fractures.  Left fifth metatarsal midshaft fracture site is well-healed.  No other bony abnormalities.    PMFS History: There are no problems to display for this patient.  History reviewed. No pertinent past medical history.  History reviewed. No pertinent family history.  History reviewed. No pertinent surgical history. Social History   Occupational History   Not on file  Tobacco Use   Smoking status: Never   Smokeless tobacco: Never  Substance and Sexual Activity   Alcohol use: Not on file   Drug use: Not on file   Sexual activity: Not on file
